# Patient Record
Sex: Female | Born: 1995 | Hispanic: Yes | Marital: Single | Smoking: Never smoker
Health system: Southern US, Community
[De-identification: ages and names within clinical notes are randomized; demographics above are authoritative.]

## PROBLEM LIST (undated history)

## (undated) DIAGNOSIS — Z789 Other specified health status: Secondary | ICD-10-CM

## (undated) HISTORY — PX: NO PAST SURGERIES: SHX2092

---

## 2018-09-15 ENCOUNTER — Inpatient Hospital Stay (HOSPITAL_COMMUNITY): Payer: BLUE CROSS/BLUE SHIELD

## 2018-09-15 ENCOUNTER — Inpatient Hospital Stay (HOSPITAL_COMMUNITY)
Admission: AD | Admit: 2018-09-15 | Discharge: 2018-09-15 | Disposition: A | Discharge status: Home / Self Care | Payer: BLUE CROSS/BLUE SHIELD | Source: Ambulatory Visit | Attending: Obstetrics & Gynecology | Admitting: Obstetrics & Gynecology

## 2018-09-15 ENCOUNTER — Encounter (HOSPITAL_COMMUNITY): Payer: Self-pay

## 2018-09-15 DIAGNOSIS — R1030 Lower abdominal pain, unspecified: Secondary | ICD-10-CM | POA: Insufficient documentation

## 2018-09-15 DIAGNOSIS — N926 Irregular menstruation, unspecified: Secondary | ICD-10-CM | POA: Diagnosis not present

## 2018-09-15 DIAGNOSIS — O209 Hemorrhage in early pregnancy, unspecified: Secondary | ICD-10-CM | POA: Diagnosis not present

## 2018-09-15 DIAGNOSIS — Z3A00 Weeks of gestation of pregnancy not specified: Secondary | ICD-10-CM | POA: Diagnosis not present

## 2018-09-15 DIAGNOSIS — Z3202 Encounter for pregnancy test, result negative: Secondary | ICD-10-CM | POA: Insufficient documentation

## 2018-09-15 DIAGNOSIS — R109 Unspecified abdominal pain: Secondary | ICD-10-CM | POA: Diagnosis not present

## 2018-09-15 DIAGNOSIS — O26891 Other specified pregnancy related conditions, first trimester: Secondary | ICD-10-CM

## 2018-09-15 HISTORY — DX: Other specified health status: Z78.9

## 2018-09-15 LAB — WET PREP, GENITAL
CLUE CELLS WET PREP: NONE SEEN
SPERM: NONE SEEN
Trich, Wet Prep: NONE SEEN
Yeast Wet Prep HPF POC: NONE SEEN

## 2018-09-15 LAB — URINALYSIS, ROUTINE W REFLEX MICROSCOPIC
BACTERIA UA: NONE SEEN
BILIRUBIN URINE: NEGATIVE
Glucose, UA: NEGATIVE mg/dL
KETONES UR: 20 mg/dL — AB
LEUKOCYTES UA: NEGATIVE
NITRITE: NEGATIVE
PH: 5 (ref 5.0–8.0)
Protein, ur: NEGATIVE mg/dL
SPECIFIC GRAVITY, URINE: 1.015 (ref 1.005–1.030)

## 2018-09-15 LAB — CBC
HEMATOCRIT: 41.6 % (ref 36.0–46.0)
HEMOGLOBIN: 14.5 g/dL (ref 12.0–15.0)
MCH: 30.4 pg (ref 26.0–34.0)
MCHC: 34.9 g/dL (ref 30.0–36.0)
MCV: 87.2 fL (ref 80.0–100.0)
Platelets: 271 10*3/uL (ref 150–400)
RBC: 4.77 MIL/uL (ref 3.87–5.11)
RDW: 13.2 % (ref 11.5–15.5)
WBC: 8.1 10*3/uL (ref 4.0–10.5)
nRBC: 0 % (ref 0.0–0.2)

## 2018-09-15 LAB — POCT PREGNANCY, URINE
Preg Test, Ur: POSITIVE — AB
Preg Test, Ur: NEGATIVE

## 2018-09-15 LAB — HCG, QUANTITATIVE, PREGNANCY: hCG, Beta Chain, Quant, S: <1 m[IU]/mL (ref <5)

## 2018-09-15 LAB — ABO/RH: ABO/RH(D): O POS

## 2018-09-15 NOTE — MAU Note (Signed)
Woke up yesterday with bad cramps that last throughout the day and stopped at 1500  Noticed a large blood clot last night  Some spotting today but no pain  LMP 04/14/18, usually come every 3 months due to birth control

## 2018-09-15 NOTE — MAU Provider Note (Addendum)
Chief Complaint: Vaginal Bleeding   First Provider Initiated Contact with Patient 09/15/18 1515     SUBJECTIVE HPI: Briana Parsons is a 22 y.o. G1P0 at Unknown gestation who presents to Maternity Admissions reporting vaginal bleeding. Pt has been taking birth control pills (doesn't know which one) until last week. Last period was in May. Last intercourse was in August, and before that was in April. Passed clot & tissue like substance last night. Thinks she had a miscarriage but hasn't taken a pregnancy test.  Had lower abdominal cramping last night but none today.  Today reports dark red/brown spotting.    Past Medical History:  Diagnosis Date  . Medical history non-contributory    OB History  Gravida Para Term Preterm AB Living  1            SAB TAB Ectopic Multiple Live Births               # Outcome Date GA Lbr Len/2nd Weight Sex Delivery Anes PTL Lv  1 Current            Past Surgical History:  Procedure Laterality Date  . NO PAST SURGERIES     Social History   Socioeconomic History  . Marital status: Single    Spouse name: Not on file  . Number of children: Not on file  . Years of education: Not on file  . Highest education level: Not on file  Occupational History  . Not on file  Social Needs  . Financial resource strain: Not on file  . Food insecurity:    Worry: Not on file    Inability: Not on file  . Transportation needs:    Medical: Not on file    Non-medical: Not on file  Tobacco Use  . Smoking status: Never Smoker  . Smokeless tobacco: Never Used  Substance and Sexual Activity  . Alcohol use: Yes  . Drug use: Never  . Sexual activity: Not on file  Lifestyle  . Physical activity:    Days per week: Not on file    Minutes per session: Not on file  . Stress: Not on file  Relationships  . Social connections:    Talks on phone: Not on file    Gets together: Not on file    Attends religious service: Not on file    Active member of club or organization:  Not on file    Attends meetings of clubs or organizations: Not on file    Relationship status: Not on file  . Intimate partner violence:    Fear of current or ex partner: Not on file    Emotionally abused: Not on file    Physically abused: Not on file    Forced sexual activity: Not on file  Other Topics Concern  . Not on file  Social History Narrative  . Not on file   Family History  Problem Relation Age of Onset  . Diabetes Maternal Grandmother   . Diabetes Paternal Grandmother    No current facility-administered medications on file prior to encounter.    Current Outpatient Medications on File Prior to Encounter  Medication Sig Dispense Refill  . ibuprofen (ADVIL,MOTRIN) 200 MG tablet Take 400 mg by mouth every 6 (six) hours as needed for cramping.     No Known Allergies  I have reviewed patient's Past Medical Hx, Surgical Hx, Family Hx, Social Hx, medications and allergies.   Review of Systems  Gastrointestinal: Positive for abdominal pain (none today).  Genitourinary: Positive for vaginal bleeding.    OBJECTIVE Patient Vitals for the past 24 hrs:  BP Temp Temp src Pulse Resp  09/15/18 1504 (!) 141/83 98.8 F (37.1 C) Oral 100 18   Constitutional: Well-developed, well-nourished female in no acute distress.  Cardiovascular: normal rate & rhythm, no murmur Respiratory: normal rate and effort. Lung sounds clear throughout GI: Abd soft, non-tender, Pos BS x 4. No guarding or rebound tenderness MS: Extremities nontender, no edema, normal ROM Neurologic: Alert and oriented x 4.  GU:     SPECULUM EXAM: NEFG, small amount of brown blood in canal, cervix pink & smooth. No active bleeding  BIMANUAL: No CMT. cervix closed; uterus normal size, no adnexal tenderness or masses.    LAB RESULTS Results for orders placed or performed during the hospital encounter of 09/15/18 (from the past 24 hour(s))  Urinalysis, Routine w reflex microscopic     Status: Abnormal   Collection  Time: 09/15/18  2:45 PM  Result Value Ref Range   Color, Urine YELLOW YELLOW   APPearance CLEAR CLEAR   Specific Gravity, Urine 1.015 1.005 - 1.030   pH 5.0 5.0 - 8.0   Glucose, UA NEGATIVE NEGATIVE mg/dL   Hgb urine dipstick LARGE (A) NEGATIVE   Bilirubin Urine NEGATIVE NEGATIVE   Ketones, ur 20 (A) NEGATIVE mg/dL   Protein, ur NEGATIVE NEGATIVE mg/dL   Nitrite NEGATIVE NEGATIVE   Leukocytes, UA NEGATIVE NEGATIVE   RBC / HPF 0-5 0 - 5 RBC/hpf   WBC, UA 0-5 0 - 5 WBC/hpf   Bacteria, UA NONE SEEN NONE SEEN   Squamous Epithelial / LPF 0-5 0 - 5   Mucus PRESENT   Pregnancy, urine POC     Status: Abnormal   Collection Time: 09/15/18  2:45 PM  Result Value Ref Range   Preg Test, Ur POSITIVE (A) NEGATIVE  CBC     Status: None   Collection Time: 09/15/18  3:41 PM  Result Value Ref Range   WBC 8.1 4.0 - 10.5 K/uL   RBC 4.77 3.87 - 5.11 MIL/uL   Hemoglobin 14.5 12.0 - 15.0 g/dL   HCT 16.1 09.6 - 04.5 %   MCV 87.2 80.0 - 100.0 fL   MCH 30.4 26.0 - 34.0 pg   MCHC 34.9 30.0 - 36.0 g/dL   RDW 40.9 81.1 - 91.4 %   Platelets 271 150 - 400 K/uL   nRBC 0.0 0.0 - 0.2 %  ABO/Rh     Status: None (Preliminary result)   Collection Time: 09/15/18  3:41 PM  Result Value Ref Range   ABO/RH(D)      O POS Performed at [facility], [address] [place], [place] [address]   hCG, quantitative, pregnancy     Status: None   Collection Time: 09/15/18  3:41 PM  Result Value Ref Range   hCG, Beta Chain, Quant, S <1 <5 mIU/mL  Wet prep, genital     Status: Abnormal   Collection Time: 09/15/18  3:46 PM  Result Value Ref Range   Yeast Wet Prep HPF POC NONE SEEN NONE SEEN   Trich, Wet Prep NONE SEEN NONE SEEN   Clue Cells Wet Prep HPF POC NONE SEEN NONE SEEN   WBC, Wet Prep HPF POC MANY (A) NONE SEEN   Sperm NONE SEEN   Pregnancy, urine POC     Status: None   Collection Time: 09/15/18  5:48 PM  Result Value Ref Range   Preg Test, Ur NEGATIVE  NEGATIVE    US Ob Less Than 14 Weeks With Ob  Transvaginal  Result Date: 09/15/2018 CLINICAL DATA:  Pregnant, 1st trimester bleeding EXAM: OBSTETRIC <14 WK [place] AND TRANSVAGINAL OB US TECHNIQUE: Both transabdominal and transvaginal ultrasound examinations were performed for complete evaluation of the gestation as well as the maternal uterus, adnexal regions, and pelvic cul-de-sac. Transvaginal technique was performed to assess early pregnancy. COMPARISON:  None. FINDINGS: Intrauterine gestational sac: None Yolk sac:  Not Visualized. Maternal uterus/adnexae: Endometrial complex measures 5 mm. Bilateral ovaries are within normal limits. Trace pelvic fluid. IMPRESSION: No IUP is visualized. By definition, in the setting of a positive pregnancy test, this reflects a pregnancy of unknown location. Differential considerations include early normal IUP, abnormal IUP/missed abortion, or nonvisualized ectopic pregnancy. Serial beta HCG is suggested. Consider repeat pelvic ultrasound in 14 days. Electronically Signed   By: Charline Bills M.D.   On: 09/15/2018 17:12   IMAGING No results found.  MAU COURSE Orders Placed This Encounter  Procedures  . Wet prep, genital  . US OB LESS THAN 14 WEEKS WITH OB TRANSVAGINAL  . Urinalysis, Routine w reflex microscopic  . CBC  . hCG, quantitative, pregnancy  . Pregnancy, urine POC  . Pregnancy, urine POC  . ABO/Rh  . Discharge patient Discharge disposition: 01-Home or Self Care; Discharge patient date: 09/15/2018   No orders of the defined types were placed in this encounter.   MDM +UPT UA, wet prep, GC/chlamydia, CBC, ABO/Rh, quant hCG, and [place] today to rule out ectopic pregnancy  Care turned over to [facility] FNP   Judeth Horn NP, 09/15/2018, 1600   Initial urine pregnancy test + reported by NT/RN Quant negative [place] negative Urine collected a 2nd time for urine pregnancy test which resulted negative.  Patient informed of all results.  Bleeding now likely 2/2 to menstrual cycle. Patient  stopped her BC pills to initiate a menstrual cycle. Last intercourse was Aug    A:  1. Abnormal menstrual cycle   2. Abdominal pain  3. Encounter for pregnancy test with result negative     P:  Discharge home in stable condition  Patient is not pregnancy Condoms always Continue BC pills Return to MAU if symptoms worsen   Sonyia Muro, Harolyn Rutherford, NP 09/15/2018 6:04 PM

## 2018-09-15 NOTE — Discharge Instructions (Signed)
Hormonal Contraception Information °Hormonal contraception is a type of birth control that uses hormones to prevent pregnancy. It usually involves a combination of the hormones estrogen and progesterone or only the hormone progesterone. Hormonal contraception works in these ways: °· It thickens the mucus in the cervix, making it harder for sperm to enter the uterus. °· It changes the lining of the uterus, making it harder for an egg to implant. °· It may stop the ovaries from releasing eggs (ovulation). Some women who take hormonal contraceptives that contain only progesterone may continue to ovulate. ° °Hormonal contraception cannot prevent sexually transmitted infections (STIs). Pregnancy may still occur. °Estrogen and progesterone contraceptives °Contraceptives that use a combination of estrogen and progesterone are available in these forms: °· Pill. Pills come in different combinations of hormones. They must be taken at the same time each day. Pills can affect your period, causing you to get your period once every three months or not at all. °· Patch. The patch must be worn on the lower abdomen for three weeks and then removed on the fourth. °· Vaginal ring. The ring is placed in the vagina and left there for three weeks. It is then removed for one week. ° °Progesterone contraceptives °Contraceptives that use progesterone only are available in these forms: °· Pill. Pills should be taken every day of the cycle. °· Intrauterine device (IUD). This device is inserted into the uterus and removed or replaced every five years or sooner. °· Implant. Plastic rods are placed under the skin of the upper arm. They are removed or replaced every three years or sooner. °· Injection. The injection is given once every 90 days. ° °What are the side effects? °The side effects of estrogen and progesterone contraceptives include: °· Nausea. °· Headaches. °· Breast tenderness. °· Bleeding or spotting between menstrual cycles. °· High  blood pressure (rare). °· Strokes, heart attacks, or blood clots (rare) ° °Side effects of progesterone-only contraceptives include: °· Nausea. °· Headaches. °· Breast tenderness. °· Unpredictable menstrual bleeding. °· High blood pressure (rare). ° °Talk to your health care provider about what side effects may affect you. °Where to find more information: °· Ask your health care provider for more information and resources about hormonal contraception. °· U.S. Department of Health and Human Services Office on Women's Health: www.womenshealth.gov °Questions to ask: °· What type of hormonal contraception is right for me? °· How long should I plan to use hormonal contraception? °· What are the side effects of the hormonal contraception method I choose? °· How can I prevent STIs while using hormonal contraception? °Contact a health care provider if: °· You start taking hormonal contraceptives and you develop persistent or severe side effects. °Summary °· Estrogen and progesterone are hormones used in many forms of birth control. °· Talk to your health care provider about what side effects may affect you. °· Hormonal contraception cannot prevent sexually transmitted infections (STIs). °· Ask your health care provider for more information and resources about hormonal contraception. °This information is not intended to replace advice given to you by your health care provider. Make sure you discuss any questions you have with your health care provider. °Document Released: 11/23/2007 Document Revised: 10/03/2016 Document Reviewed: 10/03/2016 °Elsevier Interactive Patient Education © 2018 Elsevier Inc. ° °

## 2018-09-16 LAB — GC/CHLAMYDIA PROBE AMP (~~LOC~~) NOT AT ARMC
CHLAMYDIA, DNA PROBE: NEGATIVE
Neisseria Gonorrhea: NEGATIVE

## 2019-04-13 ENCOUNTER — Telehealth: Payer: BLUE CROSS/BLUE SHIELD | Admitting: Family

## 2019-04-13 DIAGNOSIS — Z20822 Contact with and (suspected) exposure to covid-19: Secondary | ICD-10-CM

## 2019-04-13 DIAGNOSIS — Z20828 Contact with and (suspected) exposure to other viral communicable diseases: Secondary | ICD-10-CM

## 2019-04-13 NOTE — Progress Notes (Signed)
E-Visit for Corona Virus Screening  Based on your current symptoms, it seems unlikely that your symptoms are related to the Coronavirus.   Approximately 5 minutes was spent documenting and reviewing patient's chart.   You have been enrolled in MyChart Home Monitoring for COVID-19. Daily you will receive a questionnaire within the MyChart website. Our COVID-19 response team will be monitoring your responses daily.   Coronavirus disease 2019 (COVID-19) is a respiratory illness that can spread from person to person. The virus that causes COVID-19 is a new virus that was first identified in the country of [place] but is now found in multiple other countries and has spread to the [place].  Symptoms associated with the virus are mild to severe fever, cough, and shortness of breath. There is currently no vaccine to protect against COVID-19, and there is no specific antiviral treatment for the virus.   To be considered HIGH RISK for Coronavirus (COVID-19), you have to meet the following criteria:  . Traveled to [place], [place], [place], [place] or [place]; or in the [place] to [place], [place], [place], or [place]; and have fever, cough, and shortness of breath within the last 2 weeks of travel OR  . Been in close contact with a person diagnosed with COVID-19 within the last 2 weeks and have fever, cough, and shortness of breath  . IF YOU DO NOT MEET THESE CRITERIA, YOU ARE CONSIDERED LOW RISK FOR COVID-19.   It is vitally important that if you feel that you have an infection such as this virus or any other virus that you stay home and away from places where you may spread it to others.  You should self-quarantine for 14 days if you have symptoms that could potentially be coronavirus and avoid contact with people age 23 and older.    You may also take acetaminophen (Tylenol) as needed for fever.   Reduce your risk of any infection by using the same precautions used for avoiding the  common cold or flu:  Marland Kitchen. Wash your hands often with soap and warm water for at least 20 seconds.  If soap and water are not readily available, use an alcohol-based hand sanitizer with at least 60% alcohol.  . If coughing or sneezing, cover your mouth and nose by coughing or sneezing into the elbow areas of your shirt or coat, into a tissue or into your sleeve (not your hands). . Avoid shaking hands with others and consider head nods or verbal greetings only. . Avoid touching your eyes, nose, or mouth with unwashed hands.  . Avoid close contact with people who are sick. . Avoid places or events with large numbers of people in one location, like concerts or sporting events. . Carefully consider travel plans you have or are making. . If you are planning any travel outside or inside the [place], visit the CDC's Travelers' Health webpage for the latest health notices. . If you have some symptoms but not all symptoms, continue to monitor at home and seek medical attention if your symptoms worsen. . If you are having a medical emergency, call 911.  HOME CARE . Only take medications as instructed by your medical team. . Drink plenty of fluids and get plenty of rest. . A steam or ultrasonic humidifier can help if you have congestion.   GET HELP RIGHT AWAY IF: . You develop worsening fever. . You become short of breath . You cough up blood. . Your symptoms become more severe  MAKE SURE YOU   Understand these instructions.  Will watch your condition.  Will get help right away if you are not doing well or get worse.  Your e-visit answers were reviewed by a board certified advanced clinical practitioner to complete your personal care plan.  Depending on the condition, your plan could have included both over the counter or prescription medications.  If there is a problem please reply once you have received a response from your provider. Your safety is important to [place].  If you have drug allergies check your  prescription carefully.    You can use MyChart to ask questions about today's visit, request a non-urgent call back, or ask for a work or school excuse for 24 hours related to this e-Visit. If it has been greater than 24 hours you will need to follow up with your provider, or enter a new e-Visit to address those concerns. You will get an e-mail in the next two days asking about your experience.  I hope that your e-visit has been valuable and will speed your recovery. Thank you for using e-visits.

## 2019-05-17 DIAGNOSIS — Z20828 Contact with and (suspected) exposure to other viral communicable diseases: Secondary | ICD-10-CM | POA: Diagnosis not present

## 2019-06-15 ENCOUNTER — Other Ambulatory Visit: Payer: Self-pay

## 2019-06-15 ENCOUNTER — Encounter (HOSPITAL_COMMUNITY): Payer: Self-pay | Admitting: Emergency Medicine

## 2019-06-15 DIAGNOSIS — R1031 Right lower quadrant pain: Secondary | ICD-10-CM | POA: Diagnosis not present

## 2019-06-15 DIAGNOSIS — R160 Hepatomegaly, not elsewhere classified: Secondary | ICD-10-CM | POA: Diagnosis not present

## 2019-06-15 DIAGNOSIS — R110 Nausea: Secondary | ICD-10-CM | POA: Insufficient documentation

## 2019-06-15 DIAGNOSIS — N3090 Cystitis, unspecified without hematuria: Secondary | ICD-10-CM | POA: Diagnosis not present

## 2019-06-15 DIAGNOSIS — R109 Unspecified abdominal pain: Secondary | ICD-10-CM | POA: Diagnosis not present

## 2019-06-15 LAB — I-STAT BETA HCG BLOOD, ED (MC, WL, AP ONLY): I-stat hCG, quantitative: <5 m[IU]/mL (ref <5)

## 2019-06-15 LAB — COMPREHENSIVE METABOLIC PANEL
ALT: 12 U/L (ref 0–44)
AST: 18 U/L (ref 15–41)
Albumin: 4 g/dL (ref 3.5–5.0)
Alkaline Phosphatase: 61 U/L (ref 38–126)
Anion gap: 10 (ref 5–15)
BUN: 13 mg/dL (ref 6–20)
CO2: 23 mmol/L (ref 22–32)
Calcium: 9.2 mg/dL (ref 8.9–10.3)
Chloride: 104 mmol/L (ref 98–111)
Creatinine, Ser: 0.86 mg/dL (ref 0.44–1.00)
GFR calc Af Amer: >60 mL/min (ref >60)
GFR calc non Af Amer: >60 mL/min (ref >60)
Glucose, Bld: 109 mg/dL — ABNORMAL HIGH (ref 70–99)
Potassium: 3.8 mmol/L (ref 3.5–5.1)
Sodium: 137 mmol/L (ref 135–145)
Total Bilirubin: 1 mg/dL (ref 0.3–1.2)
Total Protein: 7.5 g/dL (ref 6.5–8.1)

## 2019-06-15 LAB — CBC
HCT: 42.5 % (ref 36.0–46.0)
Hemoglobin: 14.2 g/dL (ref 12.0–15.0)
MCH: 30.1 pg (ref 26.0–34.0)
MCHC: 33.4 g/dL (ref 30.0–36.0)
MCV: 90 fL (ref 80.0–100.0)
Platelets: 284 10*3/uL (ref 150–400)
RBC: 4.72 MIL/uL (ref 3.87–5.11)
RDW: 13.2 % (ref 11.5–15.5)
WBC: 20 10*3/uL — ABNORMAL HIGH (ref 4.0–10.5)
nRBC: 0 % (ref 0.0–0.2)

## 2019-06-15 LAB — LIPASE, BLOOD: Lipase: 26 U/L (ref 11–51)

## 2019-06-15 LAB — LACTIC ACID, PLASMA: Lactic Acid, Venous: 1.3 mmol/L (ref 0.5–1.9)

## 2019-06-15 MED ORDER — ACETAMINOPHEN 325 MG PO TABS
650.0000 mg | ORAL_TABLET | Freq: Once | ORAL | Status: AC | PRN
  Administered 2019-06-15: 22:00:00 650 mg via ORAL
  Filled 2019-06-15: qty 2

## 2019-06-15 MED ORDER — SODIUM CHLORIDE 0.9% FLUSH
3.0000 mL | Freq: Once | INTRAVENOUS | Status: AC
  Administered 2019-06-16: 3 mL via INTRAVENOUS

## 2019-06-15 NOTE — ED Notes (Signed)
Roll call for pt status, no answer. ENMiles 

## 2019-06-15 NOTE — ED Triage Notes (Signed)
Patient here from home with complaints of right lower abd pain radiating around to back x3days. Denies pregnancy. Denies being COVID exposed.

## 2019-06-16 ENCOUNTER — Emergency Department (HOSPITAL_COMMUNITY): Payer: BC Managed Care – PPO

## 2019-06-16 ENCOUNTER — Emergency Department (HOSPITAL_COMMUNITY)
Admission: EM | Admit: 2019-06-16 | Discharge: 2019-06-16 | Disposition: A | Discharge status: Home / Self Care | Payer: BC Managed Care – PPO | Attending: Emergency Medicine | Admitting: Emergency Medicine

## 2019-06-16 ENCOUNTER — Encounter (HOSPITAL_COMMUNITY): Payer: Self-pay

## 2019-06-16 DIAGNOSIS — R109 Unspecified abdominal pain: Secondary | ICD-10-CM | POA: Diagnosis not present

## 2019-06-16 DIAGNOSIS — N3090 Cystitis, unspecified without hematuria: Secondary | ICD-10-CM

## 2019-06-16 DIAGNOSIS — R160 Hepatomegaly, not elsewhere classified: Secondary | ICD-10-CM

## 2019-06-16 LAB — URINALYSIS, ROUTINE W REFLEX MICROSCOPIC
Bilirubin Urine: NEGATIVE
Glucose, UA: NEGATIVE mg/dL
Ketones, ur: NEGATIVE mg/dL
Nitrite: NEGATIVE
Protein, ur: NEGATIVE mg/dL
Specific Gravity, Urine: 1.01 (ref 1.005–1.030)
WBC, UA: >50 WBC/hpf — ABNORMAL HIGH (ref 0–5)
pH: 6 (ref 5.0–8.0)

## 2019-06-16 LAB — LACTIC ACID, PLASMA: Lactic Acid, Venous: 0.8 mmol/L (ref 0.5–1.9)

## 2019-06-16 MED ORDER — ONDANSETRON 4 MG PO TBDP: 4.0000 mg | ORAL_TABLET | Freq: Three times a day (TID) | ORAL | 0 refills | Status: DC | PRN

## 2019-06-16 MED ORDER — IOHEXOL 300 MG/ML  SOLN
100.0000 mL | Freq: Once | INTRAMUSCULAR | Status: AC | PRN
  Administered 2019-06-16: 05:00:00 100 mL via INTRAVENOUS

## 2019-06-16 MED ORDER — SODIUM CHLORIDE (PF) 0.9 % IJ SOLN
INTRAMUSCULAR | Status: AC
  Filled 2019-06-16: qty 50

## 2019-06-16 MED ORDER — HYDROMORPHONE HCL 1 MG/ML IJ SOLN
1.0000 mg | Freq: Once | INTRAMUSCULAR | Status: AC
  Administered 2019-06-16: 04:00:00 1 mg via INTRAVENOUS
  Filled 2019-06-16: qty 1

## 2019-06-16 MED ORDER — ACETAMINOPHEN 500 MG PO TABS
1000.0000 mg | ORAL_TABLET | Freq: Once | ORAL | Status: AC
  Administered 2019-06-16: 1000 mg via ORAL
  Filled 2019-06-16: qty 2

## 2019-06-16 MED ORDER — SODIUM CHLORIDE 0.9 % IV BOLUS
1000.0000 mL | Freq: Once | INTRAVENOUS | Status: AC
  Administered 2019-06-16: 1000 mL via INTRAVENOUS

## 2019-06-16 MED ORDER — ONDANSETRON HCL 4 MG/2ML IJ SOLN
4.0000 mg | Freq: Once | INTRAMUSCULAR | Status: AC
  Administered 2019-06-16: 04:00:00 4 mg via INTRAVENOUS
  Filled 2019-06-16: qty 2

## 2019-06-16 MED ORDER — IBUPROFEN 800 MG PO TABS: 800.0000 mg | ORAL_TABLET | Freq: Three times a day (TID) | ORAL | 0 refills | Status: DC

## 2019-06-16 MED ORDER — CEPHALEXIN 500 MG PO CAPS: 500.0000 mg | ORAL_CAPSULE | Freq: Two times a day (BID) | ORAL | 0 refills | Status: DC

## 2019-06-16 MED ORDER — SODIUM CHLORIDE 0.9 % IV SOLN
1.0000 g | Freq: Once | INTRAVENOUS | Status: AC
  Administered 2019-06-16: 1 g via INTRAVENOUS
  Filled 2019-06-16: qty 10

## 2019-06-16 NOTE — ED Provider Notes (Signed)
[facility]-EMERGENCY DEPT Provider Note   CSN: [id number] Arrival date & time: 06/15/19  2127     History   Chief Complaint Chief Complaint  Patient presents with  . Abdominal Pain  . Nausea  . Flank Pain    HPI Briana Parsons is a 23 y.o. female.     Patient presents to the emergency department with a chief complaint of right lower quadrant abdominal pain.  She states that the pain does radiate to her back and to her lower abdomen bilaterally.  She reports associated nausea, but without vomiting.  She has had low-grade fevers.  She states the pain is been gradually worsening over the past couple of days.  She rates pain is moderate to severe.  It is worsened with palpation.  She reports just finishing her menstrual cycle.  She denies any new or unusual vaginal discharge.  Denies any dysuria or hematuria.  The history is provided by the patient. No language interpreter was used.    Past Medical History:  Diagnosis Date  . Medical history non-contributory     There are no active problems to display for this patient.   Past Surgical History:  Procedure Laterality Date  . NO PAST SURGERIES       OB History    Gravida  0   Para      Term      Preterm      AB      Living        SAB      TAB      Ectopic      Multiple      Live Births               Home Medications    Prior to Admission medications   Medication Sig Start Date End Date Taking? Authorizing Provider  ibuprofen (ADVIL,MOTRIN) 200 MG tablet Take 400 mg by mouth every 6 (six) hours as needed for cramping.    [provider]    Family History Family History  Problem Relation Age of Onset  . Diabetes Maternal Grandmother   . Diabetes Paternal Grandmother     Social History Social History   Tobacco Use  . Smoking status: Never Smoker  . Smokeless tobacco: Never Used  Substance Use Topics  . Alcohol use: Yes  . Drug use: Never     Allergies    Patient has no known allergies.   Review of Systems Review of Systems  All other systems reviewed and are negative.    Physical Exam Updated Vital Signs BP (!) 127/95 (BP Location: Right Arm)   Pulse (!) 117   Temp 100.2 F (37.9 C) (Oral)   Resp 18   SpO2 99%   Physical Exam Vitals signs and nursing note reviewed.  Constitutional:      General: She is not in acute distress.    Appearance: She is well-developed.  HENT:     Head: Normocephalic and atraumatic.  Eyes:     Conjunctiva/sclera: Conjunctivae normal.  Neck:     Musculoskeletal: Neck supple.  Cardiovascular:     Rate and Rhythm: Normal rate and regular rhythm.     Heart sounds: No murmur.  Pulmonary:     Effort: Pulmonary effort is normal. No respiratory distress.     Breath sounds: Normal breath sounds.  Abdominal:     Palpations: Abdomen is soft.     Tenderness: There is abdominal tenderness.  Comments: RLQ TTP  Musculoskeletal: Normal range of motion.  Skin:    General: Skin is warm and dry.  Neurological:     Mental Status: She is alert and oriented to person, place, and time.  Psychiatric:        Mood and Affect: Mood normal.        Behavior: Behavior normal.        Thought Content: Thought content normal.        Judgment: Judgment normal.      ED Treatments / Results  Labs (all labs ordered are listed, but only abnormal results are displayed) Labs Reviewed  COMPREHENSIVE METABOLIC PANEL - Abnormal; Notable for the following components:      Result Value   Glucose, Bld 109 (*)    All other components within normal limits  CBC - Abnormal; Notable for the following components:   WBC 20.0 (*)    All other components within normal limits  URINALYSIS, ROUTINE W REFLEX MICROSCOPIC - Abnormal; Notable for the following components:   APPearance HAZY (*)    Hgb urine dipstick SMALL (*)    Leukocytes,Ua LARGE (*)    WBC, UA >50 (*)    Bacteria, UA FEW (*)    All other components within  normal limits  LIPASE, BLOOD  LACTIC ACID, PLASMA  LACTIC ACID, PLASMA  I-STAT BETA HCG BLOOD, ED ([facility], [facility], [facility] ONLY)    EKG None  Radiology Ct Abdomen Pelvis W Contrast  Result Date: 06/16/2019 CLINICAL DATA:  Acute abdominal pain with appendicitis suspected EXAM: CT ABDOMEN AND PELVIS WITH CONTRAST TECHNIQUE: Multidetector CT imaging of the abdomen and pelvis was performed using the standard protocol following bolus administration of intravenous contrast. CONTRAST:  178mL OMNIPAQUE IOHEXOL 300 MG/ML  SOLN COMPARISON:  None. FINDINGS: Lower chest:  No contributory findings. Hepatobiliary: Nearly isoenhancing 3.6 cm mass within the posterior segment right liver. No central scar-like appearance.No evidence of biliary obstruction or stone. Pancreas: Unremarkable. Spleen: Unremarkable. Adrenals/Urinary Tract: Negative adrenals. No hydronephrosis or stone. Unremarkable bladder. Stomach/Bowel:  No obstruction. No appendicitis. Vascular/Lymphatic: No acute vascular abnormality. No mass or adenopathy. Reproductive:No pathologic findings. Other: No ascites or pneumoperitoneum. Musculoskeletal: No acute abnormalities. Small volume fat within the filum terminalis. IMPRESSION: 1. Negative for appendicitis or other acute finding. 2. 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 Recommend outpatient MR characterization. Electronically Signed   By: Monte Fantasia M.D.   On: 06/16/2019 05:29    Procedures Procedures (including critical care time)  Medications Ordered in ED Medications  sodium chloride flush (NS) 0.9 % injection 3 mL (has no administration in time range)  HYDROmorphone (DILAUDID) injection 1 mg (has no administration in time range)  ondansetron (ZOFRAN) injection 4 mg (has no administration in time range)  sodium chloride 0.9 % bolus 1,000 mL (has no administration in time range)  acetaminophen (TYLENOL) tablet 650 mg (650 mg Oral Given 06/15/19 2225)     Initial Impression / Assessment and  Plan / ED Course  I have reviewed the triage vital signs and the nursing notes.  Pertinent labs & imaging results that were available during my care of the patient were reviewed by me and considered in my medical decision making (see chart for details).        Patient with right lower quadrant abdominal pain.  Pain is been worsening over the past couple days.  She reports associated nausea, but without vomiting.  She denies any dysuria or hematuria.  Denies any new vaginal discharge  or bleeding.  She states that she is just finishing her menstrual cycle.  CT scan shows no evidence of appendicitis.  Urinalysis has greater than 50 white blood cells with moderate leukocytes.  Could be cystitis versus early pyelonephritis.  Incidental finding of liver mass.  Recommend outpatient MRI and PCP follow-up.  We will give additional liter of fluid and loading dose of IV Rocephin.  Final Clinical Impressions(s) / ED Diagnoses   Final diagnoses:  Cystitis  Liver mass    ED Discharge Orders         Ordered    ondansetron (ZOFRAN ODT) 4 MG disintegrating tablet  Every 8 hours PRN     06/16/19 0540    cephALEXin (KEFLEX) 500 MG capsule  2 times daily     06/16/19 0540    ibuprofen (ADVIL) 800 MG tablet  3 times daily     06/16/19 0540           Roxy HorsemanBrowning, Mariah Gerstenberger, PA-C 06/16/19 [id number]    Zadie RhineWickline, Donald, MD 06/16/19 [phone]

## 2019-06-16 NOTE — Discharge Instructions (Signed)
Your workup today is consistent with bladder infection vs early kidney infection.  You have been given fluids and IV antibiotics.  Please continue to hydrate at home and take your antibiotic as prescribed.   You had an incidental finding of a liver mass on your CT scan.  Please discuss this with your doctor.  You will need to have an MRI for further characterization.

## 2019-06-16 NOTE — ED Notes (Signed)
Patient fluids is finishing up. Patient is discharged.

## 2019-06-24 ENCOUNTER — Other Ambulatory Visit: Payer: Self-pay | Admitting: Internal Medicine

## 2019-06-24 DIAGNOSIS — N390 Urinary tract infection, site not specified: Secondary | ICD-10-CM | POA: Diagnosis not present

## 2019-06-24 DIAGNOSIS — R160 Hepatomegaly, not elsewhere classified: Secondary | ICD-10-CM

## 2019-06-24 DIAGNOSIS — R5383 Other fatigue: Secondary | ICD-10-CM | POA: Diagnosis not present

## 2019-06-24 DIAGNOSIS — Z6822 Body mass index (BMI) 22.0-22.9, adult: Secondary | ICD-10-CM | POA: Diagnosis not present

## 2019-07-30 ENCOUNTER — Ambulatory Visit
Admission: RE | Admit: 2019-07-30 | Discharge: 2019-07-30 | Disposition: A | Discharge status: Home / Self Care | Payer: BC Managed Care – PPO | Source: Ambulatory Visit | Attending: Internal Medicine | Admitting: Internal Medicine

## 2019-07-30 ENCOUNTER — Other Ambulatory Visit: Payer: Self-pay

## 2019-07-30 DIAGNOSIS — R160 Hepatomegaly, not elsewhere classified: Secondary | ICD-10-CM

## 2019-07-30 MED ORDER — GADOBENATE DIMEGLUMINE 529 MG/ML IV SOLN
12.0000 mL | Freq: Once | INTRAVENOUS | Status: AC | PRN
  Administered 2019-07-30: 12 mL via INTRAVENOUS

## 2019-09-15 DIAGNOSIS — Z6822 Body mass index (BMI) 22.0-22.9, adult: Secondary | ICD-10-CM | POA: Diagnosis not present

## 2019-09-15 DIAGNOSIS — Z20828 Contact with and (suspected) exposure to other viral communicable diseases: Secondary | ICD-10-CM | POA: Diagnosis not present

## 2019-09-22 DIAGNOSIS — J028 Acute pharyngitis due to other specified organisms: Secondary | ICD-10-CM | POA: Diagnosis not present

## 2019-09-22 DIAGNOSIS — Z6822 Body mass index (BMI) 22.0-22.9, adult: Secondary | ICD-10-CM | POA: Diagnosis not present

## 2019-09-22 DIAGNOSIS — Z20828 Contact with and (suspected) exposure to other viral communicable diseases: Secondary | ICD-10-CM | POA: Diagnosis not present

## 2019-10-21 DIAGNOSIS — N390 Urinary tract infection, site not specified: Secondary | ICD-10-CM | POA: Diagnosis not present

## 2019-10-21 DIAGNOSIS — Z6822 Body mass index (BMI) 22.0-22.9, adult: Secondary | ICD-10-CM | POA: Diagnosis not present

## 2020-02-02 DIAGNOSIS — Z6822 Body mass index (BMI) 22.0-22.9, adult: Secondary | ICD-10-CM | POA: Diagnosis not present

## 2020-02-02 DIAGNOSIS — R110 Nausea: Secondary | ICD-10-CM | POA: Diagnosis not present

## 2020-03-18 DIAGNOSIS — Z20822 Contact with and (suspected) exposure to covid-19: Secondary | ICD-10-CM | POA: Diagnosis not present

## 2020-03-18 DIAGNOSIS — J3089 Other allergic rhinitis: Secondary | ICD-10-CM | POA: Diagnosis not present

## 2020-05-23 DIAGNOSIS — R160 Hepatomegaly, not elsewhere classified: Secondary | ICD-10-CM | POA: Diagnosis not present

## 2020-05-23 DIAGNOSIS — Z6823 Body mass index (BMI) 23.0-23.9, adult: Secondary | ICD-10-CM | POA: Diagnosis not present

## 2020-05-23 DIAGNOSIS — L508 Other urticaria: Secondary | ICD-10-CM | POA: Diagnosis not present

## 2020-05-25 ENCOUNTER — Other Ambulatory Visit: Payer: Self-pay | Admitting: Internal Medicine

## 2020-05-25 DIAGNOSIS — R160 Hepatomegaly, not elsewhere classified: Secondary | ICD-10-CM

## 2020-09-10 DIAGNOSIS — F331 Major depressive disorder, recurrent, moderate: Secondary | ICD-10-CM | POA: Diagnosis not present

## 2020-09-10 DIAGNOSIS — R110 Nausea: Secondary | ICD-10-CM | POA: Diagnosis not present

## 2020-09-10 DIAGNOSIS — F419 Anxiety disorder, unspecified: Secondary | ICD-10-CM | POA: Diagnosis not present

## 2020-09-10 DIAGNOSIS — R160 Hepatomegaly, not elsewhere classified: Secondary | ICD-10-CM | POA: Diagnosis not present

## 2020-09-28 DIAGNOSIS — R160 Hepatomegaly, not elsewhere classified: Secondary | ICD-10-CM | POA: Diagnosis not present

## 2020-09-28 DIAGNOSIS — F331 Major depressive disorder, recurrent, moderate: Secondary | ICD-10-CM | POA: Diagnosis not present

## 2020-09-28 DIAGNOSIS — F419 Anxiety disorder, unspecified: Secondary | ICD-10-CM | POA: Diagnosis not present

## 2020-09-28 DIAGNOSIS — R110 Nausea: Secondary | ICD-10-CM | POA: Diagnosis not present

## 2020-11-02 DIAGNOSIS — F331 Major depressive disorder, recurrent, moderate: Secondary | ICD-10-CM | POA: Diagnosis not present

## 2020-11-02 DIAGNOSIS — R110 Nausea: Secondary | ICD-10-CM | POA: Diagnosis not present

## 2020-11-02 DIAGNOSIS — F419 Anxiety disorder, unspecified: Secondary | ICD-10-CM | POA: Diagnosis not present

## 2020-11-02 DIAGNOSIS — R160 Hepatomegaly, not elsewhere classified: Secondary | ICD-10-CM | POA: Diagnosis not present

## 2020-12-07 LAB — LIPID PANEL
Cholesterol: 214 — AB (ref 0–200)
HDL: 77 — AB (ref 35–70)
LDL Cholesterol: 102
Triglycerides: 206 — AB (ref 40–160)

## 2020-12-07 LAB — COMPREHENSIVE METABOLIC PANEL
Albumin: 4.5 (ref 3.5–5.0)
Calcium: 9.8 (ref 8.7–10.7)
Globulin: 2.6
eGFR: 120

## 2020-12-07 LAB — BASIC METABOLIC PANEL
BUN: 17 (ref 4–21)
CO2: 23 — AB (ref 13–22)
Chloride: 101 (ref 99–108)
Creatinine: 0.7 (ref <=1.1)
Glucose: 90
Potassium: 4.2 mEq/L (ref 3.5–5.1)
Sodium: 138 (ref 137–147)

## 2020-12-07 LAB — TSH: TSH: 2.55 (ref <=5.90)

## 2020-12-07 LAB — HEPATIC FUNCTION PANEL
ALT: 16 U/L (ref 7–35)
AST: 17 (ref 13–35)
Alkaline Phosphatase: 96 (ref 25–125)
Bilirubin, Total: 0.5

## 2020-12-07 LAB — CBC AND DIFFERENTIAL
HCT: 42 (ref 36–46)
Hemoglobin: 13.8 (ref 12.0–16.0)
Neutrophils Absolute: 7.4
Neutrophils Absolute: 7.4
Platelets: 272 10*3/uL (ref 150–400)
WBC: 10.6

## 2020-12-07 LAB — HM HIV SCREENING LAB: HM HIV Screening: NEGATIVE

## 2020-12-07 LAB — CBC: RBC: 4.68 (ref 3.87–5.11)

## 2020-12-14 LAB — RESULTS CONSOLE HPV: CHL HPV: NEGATIVE

## 2020-12-14 LAB — OB RESULTS CONSOLE GC/CHLAMYDIA: Chlamydia: NEGATIVE

## 2021-07-04 IMAGING — CT CT ABDOMEN AND PELVIS WITH CONTRAST
2 of 4 series · 16 of 46 positions shown, 18 images · IV contrast (OMNIPAQUE)
Comparison: None.

CLINICAL DATA: Acute abdominal pain with appendicitis suspected

EXAM:
CT ABDOMEN AND PELVIS WITH CONTRAST
TECHNIQUE: Multidetector CT imaging of the abdomen and pelvis was performed
using the standard protocol following bolus administration of
intravenous contrast.
CONTRAST:  100mL OMNIPAQUE IOHEXOL 300 MG/ML  SOLN

[Series 2: axial st · axial · 0.68mm/px · z∈[-476,-91]mm · 13 of 87 slices shown, 15 images]
[im 5/87  soft-tissue]
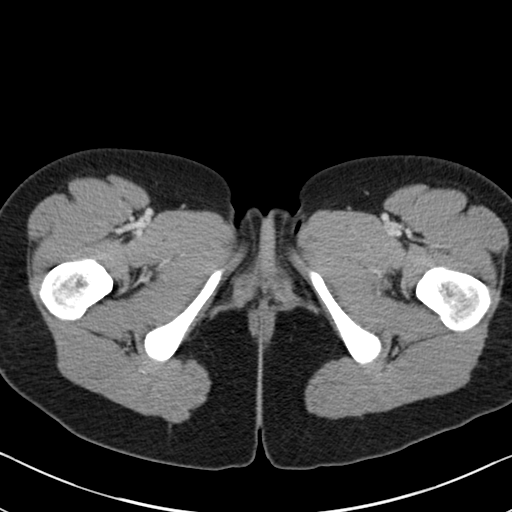
[im 5/87  bone]
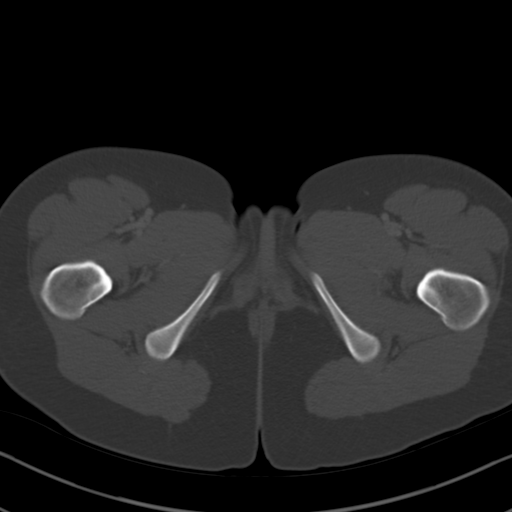
[im 13/87  soft-tissue]
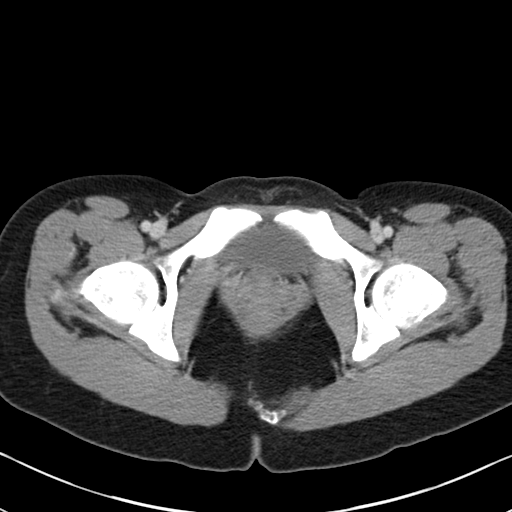
[im 18/87  soft-tissue]
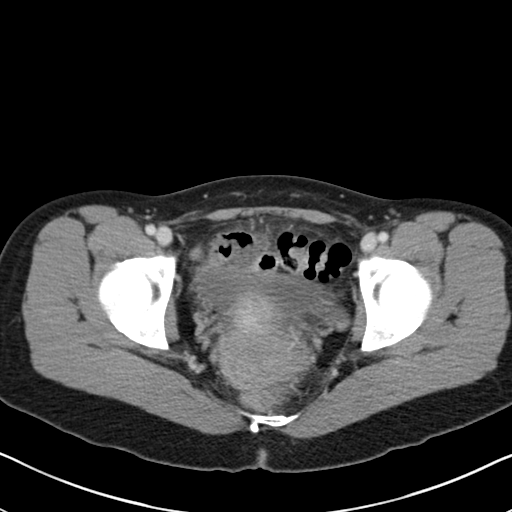
[im 26/87  soft-tissue]
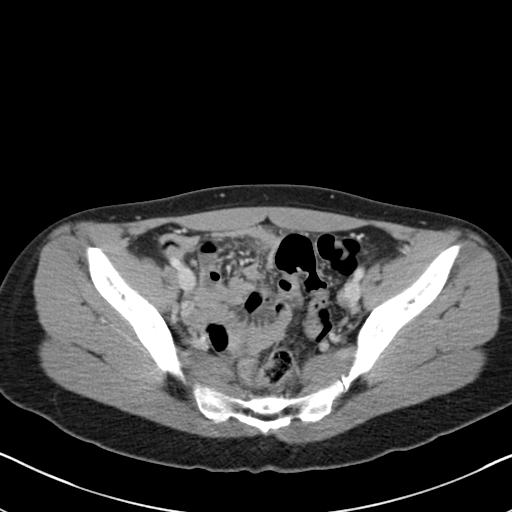
[im 31/87  soft-tissue]
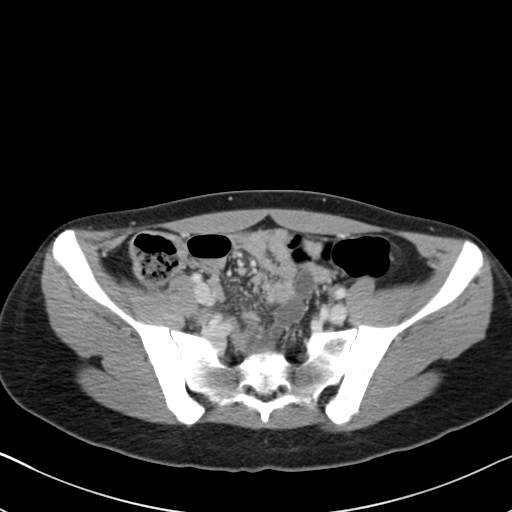
[im 39/87  soft-tissue]
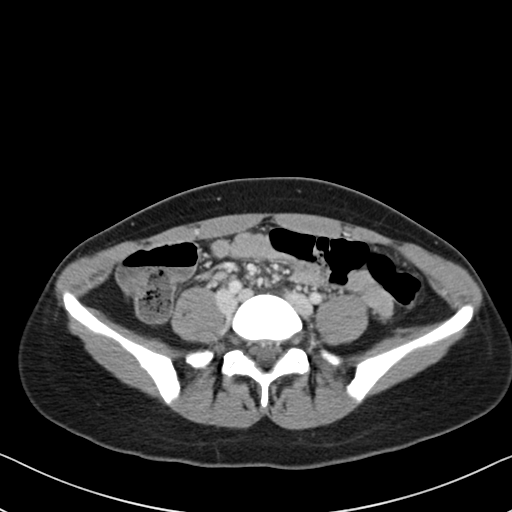
[im 44/87  soft-tissue]
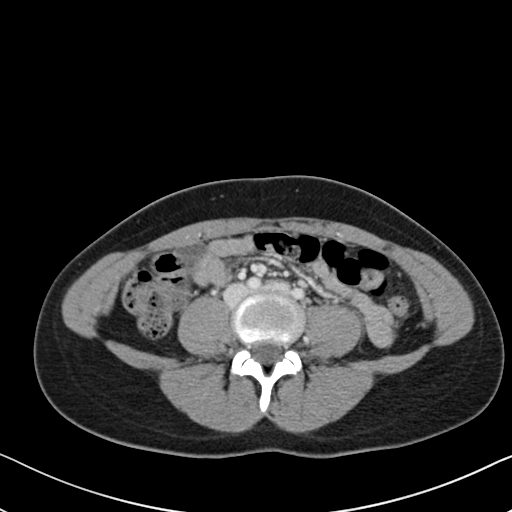
[im 48/87  soft-tissue]
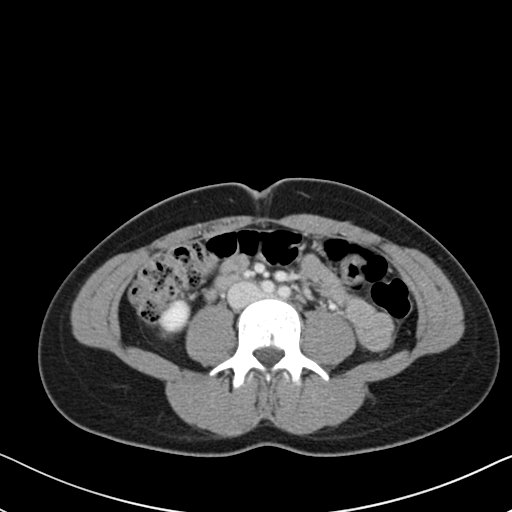
[im 56/87  soft-tissue]
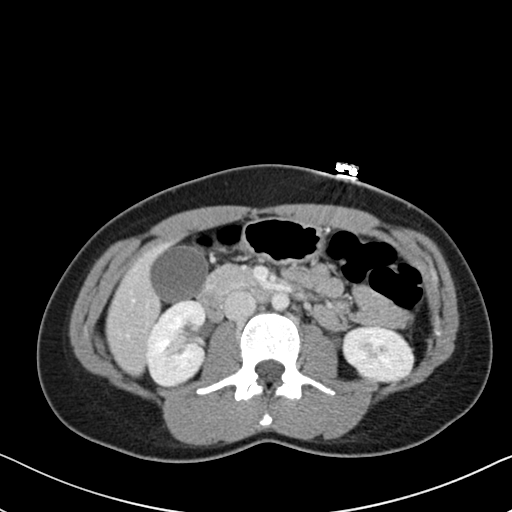
[im 56/87  bone]
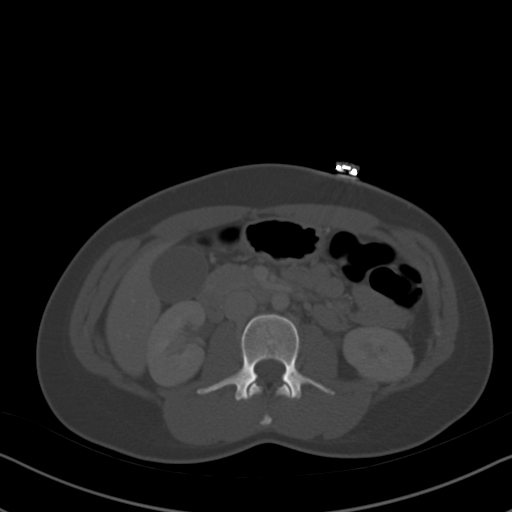
[im 61/87  soft-tissue]
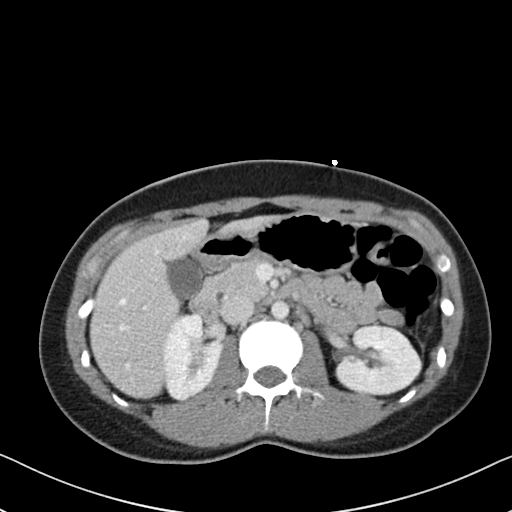
[im 69/87  soft-tissue]
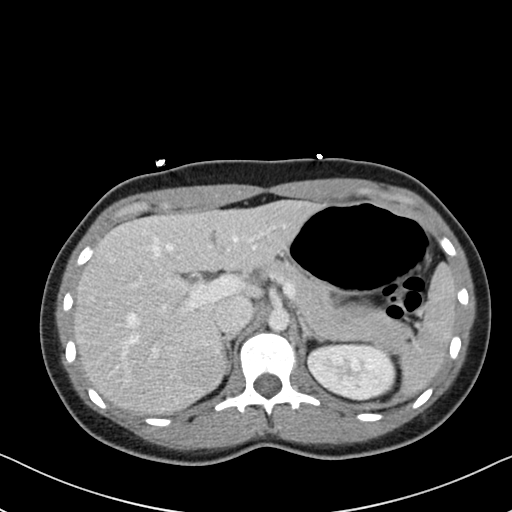
[im 74/87  soft-tissue]
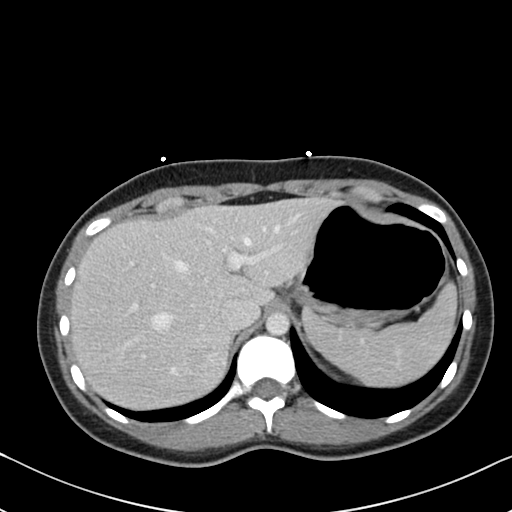
[im 82/87  soft-tissue]
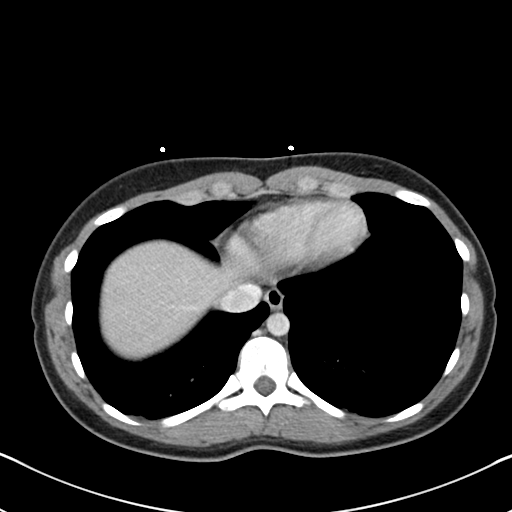

[Series 5: coronal st · coronal · 0.64mm/px · 3 of 101 slices shown]
[im 34/101  soft-tissue]
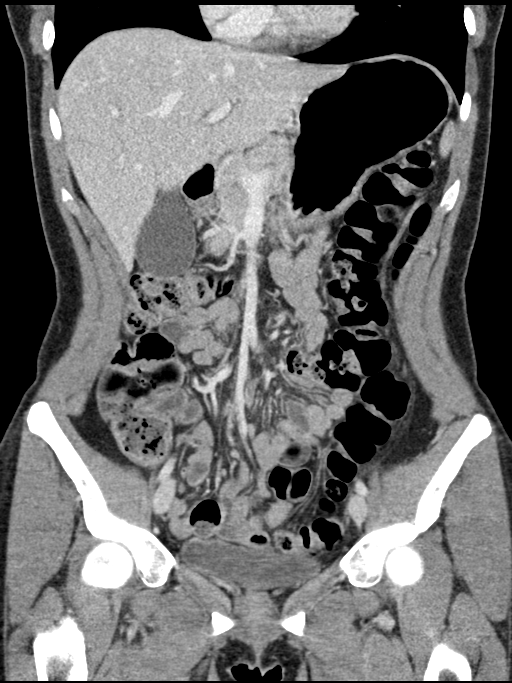
[im 45/101  soft-tissue]
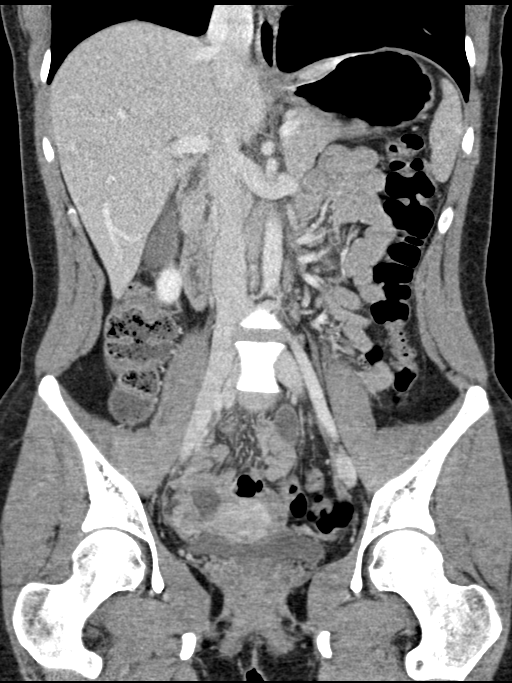
[im 56/101  soft-tissue]
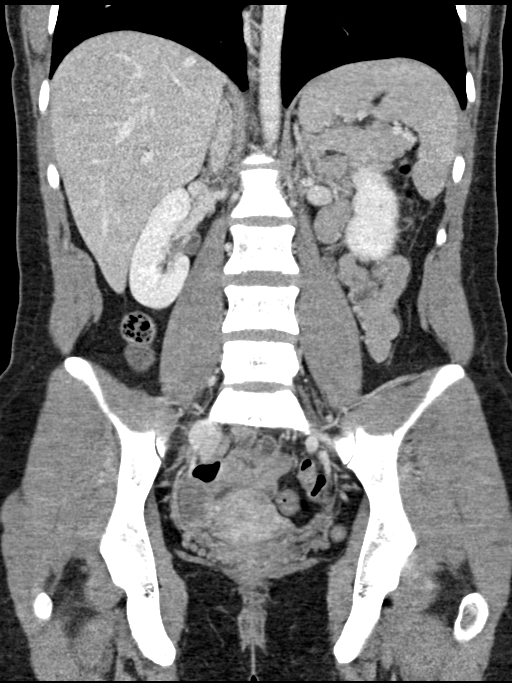

[16 of 46 positions shown; findings below may reference images not displayed]

FINDINGS: Lower chest:  No contributory findings.

Hepatobiliary: Nearly isoenhancing 3.6 cm mass within the posterior
segment right liver. No central scar-like appearance.No evidence of
biliary obstruction or stone.

Pancreas: Unremarkable.

Spleen: Unremarkable.

Adrenals/Urinary Tract: Negative adrenals. No hydronephrosis or
stone. Unremarkable bladder.

Stomach/Bowel:  No obstruction. No appendicitis.

Vascular/Lymphatic: No acute vascular abnormality. No mass or
adenopathy.

Reproductive:No pathologic findings.

Other: No ascites or pneumoperitoneum.

Musculoskeletal: No acute abnormalities. Small volume fat within the
filum terminalis.
IMPRESSION: 1. Negative for appendicitis or other acute finding.
2. 3.6 cm right liver mass, favor adenoma. Recommend outpatient MR
characterization.

## 2022-04-03 ENCOUNTER — Encounter: Payer: Self-pay | Admitting: Nurse Practitioner

## 2022-04-03 ENCOUNTER — Telehealth: Payer: Self-pay | Admitting: Nurse Practitioner

## 2022-04-03 ENCOUNTER — Ambulatory Visit: Payer: Commercial Managed Care - HMO | Admitting: Nurse Practitioner

## 2022-04-03 VITALS — BP 110/76 | HR 76 | Ht 66.0 in | Wt 166.0 lb

## 2022-04-03 DIAGNOSIS — J302 Other seasonal allergic rhinitis: Secondary | ICD-10-CM

## 2022-04-03 DIAGNOSIS — Z3045 Encounter for surveillance of transdermal patch hormonal contraceptive device: Secondary | ICD-10-CM | POA: Diagnosis not present

## 2022-04-03 DIAGNOSIS — K769 Liver disease, unspecified: Secondary | ICD-10-CM

## 2022-04-03 DIAGNOSIS — Z0000 Encounter for general adult medical examination without abnormal findings: Secondary | ICD-10-CM

## 2022-04-03 DIAGNOSIS — Z309 Encounter for contraceptive management, unspecified: Secondary | ICD-10-CM | POA: Insufficient documentation

## 2022-04-03 DIAGNOSIS — Z0001 Encounter for general adult medical examination with abnormal findings: Secondary | ICD-10-CM | POA: Diagnosis not present

## 2022-04-03 MED ORDER — FLUTICASONE PROPIONATE 50 MCG/ACT NA SUSP: 2.0000 | Freq: Every day | NASAL | 6 refills | Status: AC

## 2022-04-03 MED ORDER — XULANE 150-35 MCG/24HR TD PTWK: 1.0000 | MEDICATED_PATCH | TRANSDERMAL | 3 refills | Status: AC

## 2022-04-03 NOTE — Patient Instructions (Addendum)
Please use  Flonase nasal spray , two spray daily into both nostril for your allergies    It is important that you exercise regularly at least 30 minutes 5 times a week.  Think about what you will eat, plan ahead. Choose " clean, green, fresh or frozen" over canned, processed or packaged foods which are more sugary, salty and fatty. 70 to 75% of food eaten should be vegetables and fruit. Three meals at set times with snacks allowed between meals, but they must be fruit or vegetables. Aim to eat over a 12 hour period , example 7 am to 7 pm, and STOP after  your last meal of the day. Drink water,generally about 64 ounces per day, no other drink is as healthy. Fruit juice is best enjoyed in a healthy way, by EATING the fruit.  Thanks for choosing [facility], we consider it a privelige to serve you.

## 2022-04-03 NOTE — Progress Notes (Addendum)
New Patient Office Visit  Subjective    Patient ID: Briana Parsons, female    DOB: [birth year]  Age: 26 y.o. MRN: [id number]  CC:  Chief Complaint  Patient presents with   New Patient (Initial Visit)    Np     HPI Briana Parsons presents to establish care for her chronic medical conditions and for annual physical.  Previous PCP is Rachell Cipro in [place]. Last visit was last year February .   Birth control . On Xulane 150-71mg/24 hr transdermal patch , has been on med since 2020. States that she had a normal PAP smear in 2022.   Seasonal allergies . pt c/o allergies , sneezing, stuffy nose, coughing, she has symptoms every year around the winter time.  Patient denies fever chills body aches  Liver lesion.  Patient stated that she had a CT scan in 2020 that showed a liver mass, she had MRI done afterwards that showed a liver lesion , recommendation was to have a follow-up MRI after 6 months to check for stability of the lesion.  Patient stated that she did not follow-up with the MRI due to lack of insurance.  Patient denies abdominal pain nausea vomiting drinks alcohol occasionally.  Due for TDAP vaccine, states that she has had HPV vaccines but she does not know how many doses she had .   Records requested from her previous PCP today   Outpatient Encounter Medications as of 04/03/2022  Medication Sig   fluticasone (FLONASE) 50 MCG/ACT nasal spray Place 2 sprays into both nostrils daily.   [DISCONTINUED] XMarilu Favre150-35 MCG/24HR transdermal patch 1 patch once a week.   valACYclovir HCl (VALTREX PO) Take by mouth. As needed (Patient not taking: Reported on 04/03/2022)   XULANE 150-35 MCG/24HR transdermal patch Place 1 patch onto the skin once a week.   [DISCONTINUED] acetaminophen (TYLENOL) 500 MG tablet Take 1,000 mg by mouth every 6 (six) hours as needed for mild pain.   [DISCONTINUED] cephALEXin (KEFLEX) 500 MG capsule Take 1 capsule (500 mg total) by mouth 2 (two) times daily.    [DISCONTINUED] ibuprofen (ADVIL) 800 MG tablet Take 1 tablet (800 mg total) by mouth 3 (three) times daily.   [DISCONTINUED] ondansetron (ZOFRAN ODT) 4 MG disintegrating tablet Take 1 tablet (4 mg total) by mouth every 8 (eight) hours as needed for nausea or vomiting.   No facility-administered encounter medications on file as of 04/03/2022.    Past Medical History:  Diagnosis Date   Medical history non-contributory     Past Surgical History:  Procedure Laterality Date   NO PAST SURGERIES      Family History  Problem Relation Age of Onset   Asthma Mother    Diabetes Father    Miscarriages / Stillbirths Sister    Diabetes Maternal Grandmother    Diabetes Paternal Grandmother    Breast cancer Neg Hx    Colon cancer Neg Hx    Cervical cancer Neg Hx     Social History   Socioeconomic History   Marital status: Single    Spouse name: Not on file   Number of children: 0   Years of education: Not on file   Highest education level: Not on file  Occupational History   Not on file  Tobacco Use   Smoking status: Never   Smokeless tobacco: Never  Substance and Sexual Activity   Alcohol use: Yes    Comment: once a week   Drug use: Never   Sexual activity:  Yes    Comment: on birth control  Other Topics Concern   Not on file  Social History Narrative   Lives with her boyfriend, 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    Social Determinants of Health   Financial Resource Strain: Not on file  Food Insecurity: Not on file  Transportation Needs: Not on file  Physical Activity: Not on file  Stress: Not on file  Social Connections: Not on file  Intimate Partner Violence: Not on file    Review of Systems  Constitutional: Negative.  Negative for chills, fever and weight loss.  HENT: Negative.  Negative for ear pain, hearing loss and tinnitus.   Eyes: Negative.  Negative for blurred vision, double vision, pain, discharge and redness.  Respiratory: Negative.  Negative for  cough, hemoptysis and sputum production.   Cardiovascular: Negative.  Negative for chest pain, palpitations and orthopnea.  Gastrointestinal: Negative.  Negative for heartburn, nausea and vomiting.  Genitourinary: Negative.  Negative for dysuria, frequency and urgency.  Musculoskeletal: Negative.  Negative for back pain, myalgias and neck pain.  Skin: Negative.  Negative for itching and rash.  Neurological: Negative.  Negative for dizziness, tingling and headaches.  Endo/Heme/Allergies: Negative.  Negative for environmental allergies and polydipsia. Does not bruise/bleed easily.  Psychiatric/Behavioral: Negative.  Negative for depression, memory loss, substance abuse and suicidal ideas. The patient does not have insomnia.        Objective    BP 110/76 (BP Location: Left Arm, Patient Position: Sitting, Cuff Size: Large)   Pulse 76   Ht 5' 6" (1.676 m)   Wt 166 lb (75.3 kg)   LMP 04/02/2022 (Exact Date)   SpO2 98%   Breastfeeding No   BMI 26.79 kg/m  [place] CMA present as chaperone Physical Exam Vitals and nursing note reviewed. Exam conducted with a chaperone present.  Constitutional:      General: She is not in acute distress.    Appearance: Normal appearance. She is obese. She is not ill-appearing, toxic-appearing or diaphoretic.  HENT:     Head: Normocephalic and atraumatic.     Right Ear: Tympanic membrane, ear canal and external ear normal. There is no impacted cerumen.     Left Ear: Tympanic membrane, ear canal and external ear normal. There is no impacted cerumen.     Nose: Nose normal. No congestion or rhinorrhea.     Mouth/Throat:     Mouth: Mucous membranes are moist.     Pharynx: Oropharynx is clear. No oropharyngeal exudate.  Eyes:     General:        Right eye: No discharge.        Left eye: No discharge.     Conjunctiva/sclera: Conjunctivae normal.     Pupils: Pupils are equal, round, and reactive to light.  Neck:     Vascular: No carotid bruit.   Cardiovascular:     Rate and Rhythm: Normal rate and regular rhythm.     Heart sounds: Normal heart sounds. No murmur heard.   No friction rub. No gallop.  Pulmonary:     Effort: No respiratory distress.     Breath sounds: No stridor. No wheezing, rhonchi or rales.  Chest:     Chest wall: No mass, lacerations, deformity, swelling, tenderness, crepitus or edema. There is no dullness to percussion.  Breasts:    Tanner Score is 5.     Breasts are symmetrical.     Right: Normal. No swelling, bleeding, inverted nipple, mass, nipple discharge,  skin change or tenderness.     Left: Normal. No swelling, bleeding, inverted nipple, mass, nipple discharge, skin change or tenderness.  Abdominal:     General: Abdomen is flat. There is no distension.     Palpations: Abdomen is soft. There is no mass.     Tenderness: There is no abdominal tenderness. There is no right CVA tenderness, left CVA tenderness, guarding or rebound.     Hernia: No hernia is present.  Musculoskeletal:        General: No swelling, tenderness, deformity or signs of injury.     Cervical back: Normal range of motion and neck supple. No rigidity or tenderness.     Right lower leg: No edema.     Left lower leg: No edema.  Lymphadenopathy:     Cervical: No cervical adenopathy.     Upper Body:     Right upper body: No supraclavicular, axillary or pectoral adenopathy.     Left upper body: No supraclavicular, axillary or pectoral adenopathy.  Skin:    General: Skin is warm and dry.     Capillary Refill: Capillary refill takes 2 to 3 seconds.     Coloration: Skin is not jaundiced or pale.     Findings: No bruising, erythema, lesion or rash.  Neurological:     Mental Status: She is alert and oriented to person, place, and time.     Cranial Nerves: No cranial nerve deficit.     Sensory: No sensory deficit.     Motor: No weakness.     Coordination: Coordination normal.     Gait: Gait normal.     Deep Tendon Reflexes: Reflexes  normal.  Psychiatric:        Mood and Affect: Mood normal.        Behavior: Behavior normal.        Thought Content: Thought content normal.        Judgment: Judgment normal.        Assessment & Plan:   Problem List Items Addressed This Visit       Digestive   Liver lesion    Incidental findings on CT scan done in 2020. MRI of the abdomen ordered today patient recommendation Patient denies nausea vomiting abdominal pain drinks alcohol occasionally       Relevant Orders   MR Abdomen W Wo Contrast     Other   Annual physical exam    Annual exam as documented.  Counseling done include healthy lifestyle involving committing to 150 minutes of exercise per week, heart healthy diet, and attaining healthy weight. The importance of adequate sleep also discussed.  Regular use of seat belt and home safety were also discussed . Changes in health habits are decided on by patient with goals and time frames set for achieving them. Immunization and cancer screening  needs are specifically addressed at this visit.  Patient declined Tdap vaccine today.        Relevant Orders   Lipid Profile   HgB A1c   CBC with Differential   CMP14+EGFR   TSH   Vitamin D (25 hydroxy)   HIV antibody (with reflex)   Hepatitis C Antibody   Seasonal allergies - Primary    Start Flonase nasal spray 2 spray daily       Relevant Medications   fluticasone (FLONASE) 50 MCG/ACT nasal spray   Contraception management    On Xulane 150-35 mcg per 24 hours transdermal patch Medication refill today Patient told to apply  patch once weekly for 3 weeks followed by 1 week at his patch. She verbalized understanding,         Return in about 1 year (around 04/04/2023) for annaul physical .   Renee Rival, FNP

## 2022-04-03 NOTE — Assessment & Plan Note (Signed)
On Xulane 150-35 mcg per 24 hours transdermal patch Medication refill today Patient told to apply patch once weekly for 3 weeks followed by 1 week at his patch. She verbalized understanding,

## 2022-04-03 NOTE — Assessment & Plan Note (Signed)
Annual exam as documented.  Counseling done include healthy lifestyle involving committing to 150 minutes of exercise per week, heart healthy diet, and attaining healthy weight. The importance of adequate sleep also discussed.  Regular use of seat belt and home safety were also discussed . Changes in health habits are decided on by patient with goals and time frames set for achieving them. Immunization and cancer screening  needs are specifically addressed at this visit.  Patient declined Tdap vaccine today.

## 2022-04-03 NOTE — Assessment & Plan Note (Addendum)
Incidental findings on CT scan done in 2020. MRI of the abdomen ordered today patient recommendation Patient denies nausea vomiting abdominal pain drinks alcohol occasionally

## 2022-04-03 NOTE — Assessment & Plan Note (Signed)
Start Flonase nasal spray 2 spray daily

## 2022-04-04 ENCOUNTER — Encounter: Payer: Self-pay | Admitting: Nurse Practitioner

## 2022-04-04 NOTE — Telephone Encounter (Signed)
error 

## 2023-04-08 ENCOUNTER — Encounter: Payer: Commercial Managed Care - HMO | Admitting: Nurse Practitioner
